# Patient Record
Sex: Male | Born: 2000 | Race: White | Hispanic: No | Marital: Single
Health system: Southern US, Community
[De-identification: ages and names within clinical notes are randomized; demographics above are authoritative.]

---
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
## 2021-04-13 ENCOUNTER — Emergency Department
Admission: EM | Admit: 2021-04-13 | Discharge: 2021-04-13 | Disposition: A | Discharge status: Home / Self Care | Payer: BC Managed Care – PPO | Source: Home / Self Care | Attending: Family Medicine | Admitting: Family Medicine

## 2021-04-13 ENCOUNTER — Other Ambulatory Visit: Payer: Self-pay

## 2021-04-13 ENCOUNTER — Encounter: Payer: Self-pay | Admitting: Emergency Medicine

## 2021-04-13 DIAGNOSIS — J111 Influenza due to unidentified influenza virus with other respiratory manifestations: Secondary | ICD-10-CM

## 2021-04-13 MED ORDER — OSELTAMIVIR PHOSPHATE 75 MG PO CAPS: 75.0000 mg | ORAL_CAPSULE | Freq: Two times a day (BID) | ORAL | 0 refills | Status: DC

## 2021-04-13 MED ORDER — ONDANSETRON HCL 8 MG PO TABS: 8.0000 mg | ORAL_TABLET | Freq: Three times a day (TID) | ORAL | 0 refills | Status: DC | PRN

## 2021-04-13 MED ORDER — ACETAMINOPHEN 325 MG PO TABS
650.0000 mg | ORAL_TABLET | Freq: Once | ORAL | Status: AC
  Administered 2021-04-13: 650 mg via ORAL

## 2021-04-13 NOTE — ED Triage Notes (Signed)
At college - roommate & girlfriend  both have flu  Pt started feeling sick yesterday  Body aches, cough & congestion  No tylenol today - took some last night No flu vaccine

## 2021-04-13 NOTE — Discharge Instructions (Signed)
Get plenty of rest Drink lots of fluids Take Zofran as needed for nausea and vomiting.   Take the Tamiflu 2 times a day for 5 days.  I have printed the Tamiflu because some pharmacies have limited supply (you may need to shop around) Call if not improving towards the end of the week

## 2021-04-13 NOTE — ED Provider Notes (Signed)
[facility]    CSN: [id number] Arrival date & time: 04/13/21  0911      History   Chief Complaint Chief Complaint  Patient presents with   Fever   Generalized Body Aches    HPI Kenneth Aguirre is a 20 y.o. male.    Patient is away at college.  His roommate has influenza.  Services girlfriend.  They were tested and found to have influenza A.  Patient started feeling sick yesterday with headaches, body aches, fatigue, postnasal drip, and cough.  Sudden onset.  He has not had a flu shot.  Has also had some nausea but no vomiting HPI  Past Medical History:  Diagnosis Date   Asthma     Patient Active Problem List   Diagnosis Date Noted   Pain in left ankle and joints of left foot 05/12/2020    History reviewed. No pertinent surgical history.     Home Medications    Prior to Admission medications   Medication Sig Start Date End Date Taking? Authorizing Provider  ondansetron (ZOFRAN) 8 MG tablet Take 1 tablet (8 mg total) by mouth every 8 (eight) hours as needed for nausea or vomiting. 04/13/21  Yes Eustace Moore, MD  oseltamivir (TAMIFLU) 75 MG capsule Take 1 capsule (75 mg total) by mouth every 12 (twelve) hours. 04/13/21  Yes Eustace Moore, MD  cetirizine (ZYRTEC) 10 MG tablet Take by mouth.    [provider]    Family History Family History  Problem Relation Age of Onset   Healthy Mother    Healthy Father    Healthy Sister    Healthy Sister    Healthy Sister     Social History Social History   Tobacco Use   Smoking status: Never    Passive exposure: Never   Smokeless tobacco: Never  Vaping Use   Vaping Use: Never used  Substance Use Topics   Alcohol use: Never   Drug use: Never     Allergies   Patient has no known allergies.   Review of Systems Review of Systems See HPI  Physical Exam Triage Vital Signs ED Triage Vitals  Enc Vitals Group     BP 04/13/21 0958 113/69     Pulse Rate 04/13/21 0958 82      Resp 04/13/21 0958 16     Temp 04/13/21 0958 99.6 F (37.6 C)     Temp Source 04/13/21 0958 Oral     SpO2 04/13/21 0958 100 %     Weight 04/13/21 1001 168 lb (76.2 kg)     Height 04/13/21 1001 5\' 9"  (1.753 m)     Head Circumference --      Peak Flow --      Pain Score 04/13/21 1001 5     Pain Loc --      Pain Edu? --      Excl. in GC? --    No data found.  Updated Vital Signs BP 113/69 (BP Location: Left Arm)   Pulse 82   Temp 99.6 F (37.6 C) (Oral)   Resp 16   Ht 5\' 9"  (1.753 m)   Wt 76.2 kg   SpO2 100%   BMI 24.81 kg/m       Physical Exam Constitutional:      General: He is not in acute distress.    Appearance: He is well-developed. He is ill-appearing.  HENT:     Head: Normocephalic and atraumatic.     Right Ear:  Tympanic membrane, ear canal and external ear normal.     Left Ear: Tympanic membrane, ear canal and external ear normal.     Nose: Congestion present.     Mouth/Throat:     Pharynx: Posterior oropharyngeal erythema present.  Eyes:     Conjunctiva/sclera: Conjunctivae normal.     Pupils: Pupils are equal, round, and reactive to light.  Cardiovascular:     Rate and Rhythm: Normal rate and regular rhythm.     Heart sounds: Normal heart sounds.  Pulmonary:     Effort: Pulmonary effort is normal. No respiratory distress.     Breath sounds: Normal breath sounds.  Abdominal:     General: There is no distension.     Palpations: Abdomen is soft.  Musculoskeletal:        General: Normal range of motion.     Cervical back: Normal range of motion.  Lymphadenopathy:     Cervical: No cervical adenopathy.  Skin:    General: Skin is warm and dry.  Neurological:     Mental Status: He is alert.  Psychiatric:        Mood and Affect: Mood normal.        Behavior: Behavior normal.     UC Treatments / Results  Labs (all labs ordered are listed, but only abnormal results are displayed) Labs Reviewed - No data to display  EKG   Radiology No results  found.  Procedures Procedures (including critical care time)  Medications Ordered in UC Medications  acetaminophen (TYLENOL) tablet 650 mg (650 mg Oral Given 04/13/21 1050)    Initial Impression / Assessment and Plan / UC Course  I have reviewed the triage vital signs and the nursing notes.  Pertinent labs & imaging results that were available during my care of the patient were reviewed by me and considered in my medical decision making (see chart for details).     Patient has influenza.  Discussed.  Note given for school Final Clinical Impressions(s) / UC Diagnoses   Final diagnoses:  Influenza     Discharge Instructions      Get plenty of rest Drink lots of fluids Take Zofran as needed for nausea and vomiting.   Take the Tamiflu 2 times a day for 5 days.  I have printed the Tamiflu because some pharmacies have limited supply (you may need to shop around) Call if not improving towards the end of the week   ED Prescriptions     Medication Sig Dispense Auth. Provider   ondansetron (ZOFRAN) 8 MG tablet Take 1 tablet (8 mg total) by mouth every 8 (eight) hours as needed for nausea or vomiting. 20 tablet Eustace Moore, MD   oseltamivir (TAMIFLU) 75 MG capsule Take 1 capsule (75 mg total) by mouth every 12 (twelve) hours. 10 capsule Eustace Moore, MD      PDMP not reviewed this encounter.   Eustace Moore, MD 04/13/21 [phone]

## 2021-04-15 ENCOUNTER — Telehealth: Payer: Self-pay

## 2021-04-15 NOTE — Telephone Encounter (Signed)
Call from pts mother requesting school note extension. Note left at front desk for pt to pick up

## 2021-05-20 ENCOUNTER — Ambulatory Visit: Payer: BC Managed Care – PPO | Admitting: Medical-Surgical

## 2021-05-26 ENCOUNTER — Ambulatory Visit: Payer: BC Managed Care – PPO | Admitting: Medical-Surgical

## 2021-12-10 ENCOUNTER — Encounter: Payer: Self-pay | Admitting: Family Medicine

## 2021-12-10 ENCOUNTER — Ambulatory Visit: Payer: BC Managed Care – PPO | Admitting: Family Medicine

## 2021-12-10 DIAGNOSIS — Z0000 Encounter for general adult medical examination without abnormal findings: Secondary | ICD-10-CM

## 2021-12-10 NOTE — Progress Notes (Signed)
Kenneth Aguirre - 21 y.o. male MRN [id number]  Date of birth: 01/15/2001  Subjective Chief Complaint  Patient presents with   Establish Care   Annual Exam    HPI Kenneth Aguirre is a 21 year old male here today for initial visit and annual exam.  He has been in fairly good health.  He has had some minor orthopedic issues.  Seen by Dr. Benjamin Stain last year.  He does exercise regularly.  Feels like his diet is pretty healthy.  He is a non-smoker.  Denies alcohol use.  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  He is studying business.  Review of Systems  Constitutional:  Negative for chills, fever, malaise/fatigue and weight loss.  HENT:  Negative for congestion, ear pain and sore throat.   Eyes:  Negative for blurred vision, double vision and pain.  Respiratory:  Negative for cough and shortness of breath.   Cardiovascular:  Negative for chest pain and palpitations.  Gastrointestinal:  Negative for abdominal pain, blood in stool, constipation, heartburn and nausea.  Genitourinary:  Negative for dysuria and urgency.  Musculoskeletal:  Negative for joint pain and myalgias.  Neurological:  Negative for dizziness and headaches.  Endo/Heme/Allergies:  Does not bruise/bleed easily.  Psychiatric/Behavioral:  Negative for depression. The patient is not nervous/anxious and does not have insomnia.     No Known Allergies  Past Medical History:  Diagnosis Date   Asthma     History reviewed. No pertinent surgical history.  Social History   Socioeconomic History   Marital status: Single    Spouse name: Not on file   Number of children: Not on file   Years of education: Not on file   Highest education level: Not on file  Occupational History   Occupation: Student - [place]  Tobacco Use   Smoking status: Never    Passive exposure: Never   Smokeless tobacco: Never  Vaping Use   Vaping Use: Never used  Substance and Sexual Activity   Alcohol use: Never   Drug use: Never    Sexual activity: Not Currently  Other Topics Concern   Not on file  Social History Narrative   Not on file   Social Determinants of Health   Financial Resource Strain: Not on file  Food Insecurity: Not on file  Transportation Needs: Not on file  Physical Activity: Not on file  Stress: Not on file  Social Connections: Not on file    Family History  Problem Relation Age of Onset   Healthy Mother    Healthy Father    Healthy Sister    Healthy Sister    Healthy Sister    Breast cancer Maternal Grandmother    Hypertension Paternal Grandfather    Heart attack Paternal Grandfather     Health Maintenance  Topic Date Due   HIV Screening  Never done   Hepatitis C Screening  Never done   COVID-19 Vaccine (4 - Pfizer series) 08/08/2020   INFLUENZA VACCINE  01/05/2022   TETANUS/TDAP  12/19/2023   HPV VACCINES  Completed     ----------------------------------------------------------------------------------------------------------------------------------------------------------------------------------------------------------------- Physical Exam BP (!) 93/55 (BP Location: Right Arm, Patient Position: Sitting, Cuff Size: Normal)   Pulse (!) 56   Ht 5' 8.7" (1.745 m)   Wt 178 lb 3.2 oz (80.8 kg)   SpO2 99%   BMI 26.55 kg/m   Physical Exam Constitutional:      General: He is not in acute distress. HENT:     Head: Normocephalic and atraumatic.  Right Ear: Tympanic membrane and external ear normal.     Left Ear: Tympanic membrane and external ear normal.  Eyes:     General: No scleral icterus. Neck:     Thyroid: No thyromegaly.  Cardiovascular:     Rate and Rhythm: Normal rate and regular rhythm.     Heart sounds: Normal heart sounds.  Pulmonary:     Effort: Pulmonary effort is normal.     Breath sounds: Normal breath sounds.  Abdominal:     General: Bowel sounds are normal. There is no distension.     Palpations: Abdomen is soft.     Tenderness: There is no  abdominal tenderness. There is no guarding.  Musculoskeletal:     Cervical back: Normal range of motion.  Lymphadenopathy:     Cervical: No cervical adenopathy.  Skin:    General: Skin is warm and dry.     Findings: No rash.  Neurological:     Mental Status: He is alert and oriented to person, place, and time.     Cranial Nerves: No cranial nerve deficit.     Motor: No abnormal muscle tone.  Psychiatric:        Mood and Affect: Mood normal.        Behavior: Behavior normal.     ------------------------------------------------------------------------------------------------------------------------------------------------------------------------------------------------------------------- Assessment and Plan  Well adult exam Well adult Screenings: Up-to-date Immunizations: Up-to-date Anticipatory guidance/risk factor reduction: Recommendations per AVS.   No orders of the defined types were placed in this encounter.   No follow-ups on file.    This visit occurred during the SARS-CoV-2 public health emergency.  Safety protocols were in place, including screening questions prior to the visit, additional usage of staff PPE, and extensive cleaning of exam room while observing appropriate contact time as indicated for disinfecting solutions.

## 2021-12-10 NOTE — Patient Instructions (Signed)
Preventive Care 20-21 Years Old, Male ?Preventive care refers to lifestyle choices and visits with your health care provider that can promote health and wellness. At this stage in your life, you may start seeing a primary care physician instead of a pediatrician for your preventive care. Preventive care visits are also called wellness exams. ?What can I expect for my preventive care visit? ?Counseling ?During your preventive care visit, your health care provider may ask about your: ?Medical history, including: ?Past medical problems. ?Family medical history. ?Current health, including: ?Home life and relationship well-being. ?Emotional well-being. ?Sexual activity and sexual health. ?Lifestyle, including: ?Alcohol, nicotine or tobacco, and drug use. ?Access to firearms. ?Diet, exercise, and sleep habits. ?Sunscreen use. ?Motor vehicle safety. ?Physical exam ?Your health care provider may check your: ?Height and weight. These may be used to calculate your BMI (body mass index). BMI is a measurement that tells if you are at a healthy weight. ?Waist circumference. This measures the distance around your waistline. This measurement also tells if you are at a healthy weight and may help predict your risk of certain diseases, such as type 2 diabetes and high blood pressure. ?Heart rate and blood pressure. ?Body temperature. ?Skin for abnormal spots. ?What immunizations do I need? ? ?Vaccines are usually given at various ages, according to a schedule. Your health care provider will recommend vaccines for you based on your age, medical history, and lifestyle or other factors, such as travel or where you work. ?What tests do I need? ?Screening ?Your health care provider may recommend screening tests for certain conditions. This may include: ?Vision and hearing tests. ?Lipid and cholesterol levels. ?Hepatitis B test. ?Hepatitis C test. ?HIV (human immunodeficiency virus) test. ?STI (sexually transmitted infection) testing, if  you are at risk. ?Tuberculosis skin test. ?Talk with your health care provider about your test results, treatment options, and if necessary, the need for more tests. ?Follow these instructions at home: ?Eating and drinking ? ?Eat a healthy diet that includes fresh fruits and vegetables, whole grains, lean protein, and low-fat dairy products. ?Drink enough fluid to keep your urine pale yellow. ?Do not drink alcohol if: ?Your health care provider tells you not to drink. ?You are under the legal drinking age. In the [place] the legal drinking age is 21. ?If you drink alcohol: ?Limit how much you have to 0-2 drinks a day. ?Know how much alcohol is in your drink. In the [place] one drink equals one 12 oz bottle of beer (355 mL), one 5 oz glass of wine (148 mL), or one 1? oz glass of hard liquor (44 mL). ?Lifestyle ?Brush your teeth every morning and night with fluoride toothpaste. Floss one time each day. ?Exercise for at least 30 minutes 5 or more days of the week. ?Do not use any products that contain nicotine or tobacco. These products include cigarettes, chewing tobacco, and vaping devices, such as e-cigarettes. If you need help quitting, ask your health care provider. ?Do not use drugs. ?If you are sexually active, practice safe sex. Use a condom or other form of protection to prevent STIs. ?Find healthy ways to manage stress, such as: ?Meditation, yoga, or listening to music. ?Journaling. ?Talking to a trusted person. ?Spending time with friends and family. ?Safety ?Always wear your seat belt while driving or riding in a vehicle. ?Do not drive: ?If you have been drinking alcohol. Do not ride with someone who has been drinking. ?When you are tired or distracted. ?While texting. ?If you have been using   any mind-altering substances or drugs. ?Wear a helmet and other protective equipment during sports activities. ?If you have firearms in your house, make sure you follow all gun safety procedures. ?Seek help if you have  been bullied, physically abused, or sexually abused. ?Use the internet responsibly to avoid dangers, such as online bullying and online sex predators. ?What's next? ?Go to your health care provider once a year for an annual wellness visit. ?Ask your health care provider how often you should have your eyes and teeth checked. ?Stay up to date on all vaccines. ?This information is not intended to replace advice given to you by your health care provider. Make sure you discuss any questions you have with your health care provider. ?Document Revised: 11/19/2020 Document Reviewed: 11/19/2020 ?Elsevier Patient Education ? 2023 Elsevier Inc. ? ?

## 2021-12-10 NOTE — Assessment & Plan Note (Signed)
Well adult Screenings: Up-to-date Immunizations: Up-to-date Anticipatory guidance/risk factor reduction: Recommendations per AVS. 

## 2022-11-05 ENCOUNTER — Ambulatory Visit: Payer: BC Managed Care – PPO | Admitting: Medical-Surgical

## 2022-11-05 ENCOUNTER — Encounter: Payer: Self-pay | Admitting: Medical-Surgical

## 2022-11-05 VITALS — BP 117/69 | HR 51 | Resp 20 | Ht 68.7 in | Wt 176.5 lb

## 2022-11-05 DIAGNOSIS — H5789 Other specified disorders of eye and adnexa: Secondary | ICD-10-CM | POA: Diagnosis not present

## 2022-11-05 MED ORDER — AMOXICILLIN-POT CLAVULANATE 875-125 MG PO TABS: 1.0000 | ORAL_TABLET | Freq: Two times a day (BID) | ORAL | 0 refills | Status: DC

## 2022-11-05 NOTE — Progress Notes (Signed)
        Established patient visit  History, exam, impression, and plan:  1. Eye swelling, right Pleasant 22 year old male presenting today for evaluation of right eye swelling and discomfort that started yesterday morning.  He awoke with pain just below the right eye and extending up into the right side of the nose.  The area was discolored and mildly swollen.  Notes that it was sore to touch and also uncomfortable with squinting or hard blinking.  This morning, it was even more swollen.  No blurry vision or pain with eye motions.  No discharge or crusting in the mornings.  No recent injury to the eye.  Denies fever, chills, vision changes, headache, dizziness.  Does have some mild sinus drainage but nothing significant.  On exam, EOM intact with grossly normal vision bilaterally.  Sclera white without injection or erythema.  Mild upper lid edema with lower eyelid purpleish discoloration and edema.  Tender to touch along the lower lid and nearing the tear duct.  Concern for possible cellulitis versus dacryocystitis.  Plan to treat with Augmentin twice daily for 1 week.  Reviewed recommendation for warm compresses several times a day.  Monitor for worsening of symptoms and if this occurs seek urgent care/ED evaluation immediately.  Procedures performed this visit: None.  Return if symptoms worsen or fail to improve.  __________________________________ Thayer Ohm, DNP, APRN, FNP-BC Primary Care and Sports Medicine [facility]

## 2022-12-23 ENCOUNTER — Ambulatory Visit: Payer: BC Managed Care – PPO | Admitting: Family Medicine

## 2022-12-23 ENCOUNTER — Encounter: Payer: Self-pay | Admitting: Family Medicine

## 2022-12-23 VITALS — BP 98/61 | HR 54 | Ht 68.7 in | Wt 175.0 lb

## 2022-12-23 DIAGNOSIS — S93402A Sprain of unspecified ligament of left ankle, initial encounter: Secondary | ICD-10-CM | POA: Diagnosis not present

## 2022-12-23 DIAGNOSIS — F418 Other specified anxiety disorders: Secondary | ICD-10-CM

## 2022-12-23 MED ORDER — SERTRALINE HCL 100 MG PO TABS
100.0000 mg | ORAL_TABLET | Freq: Every day | ORAL | 1 refills | Status: DC
Start: 2022-12-23 — End: 2023-06-21

## 2022-12-23 NOTE — Patient Instructions (Signed)
Ankle Sprain  An ankle sprain is a stretch or tear in a ligament in your ankle. Ligaments are tissues that connect bones to each other.  An ankle sprain can happen when: The ankle rolls outward. This is called an inversion sprain. The ankle rolls inward. This is called an eversion sprain. What are the causes? An ankle sprain is caused by rolling or twisting your ankle. What increases the risk? You are more likely to get an ankle sprain if you play sports. What are the signs or symptoms?  Pain in your ankle. Swelling. Bruising. Bruises may form right after you sprain your ankle or 1-2 days later. Trouble standing or walking. How is this treated? An ankle sprain may be treated with: A brace or splint. This is used to keep the ankle from moving until it heals. An elastic bandage (dressing). This is used to support the ankle. Crutches. Pain medicine. Surgery. This may be needed if the sprain is very bad. Physical therapy. This can help you move your ankle better. Follow these instructions at home: If you have a brace or a splint that can be taken off: Wear the brace or splint as told by your doctor. Take it off only as told by your doctor. Check the skin around the brace or splint every day. Tell your doctor if you see problems. Loosen the brace or splint if your toes: Tingle. Become numb. Turn cold and blue. Keep the brace or splint clean and dry. If the brace or splint is not waterproof: Do not let it get wet. Cover it with a watertight covering when you take a bath or a shower. If you have an elastic dressing: Take it off to shower or bathe. Adjust it if it feels too tight. Loosen the dressing if your foot: Tingles. Becomes numb. Turns cold and blue. Managing pain, stiffness, and swelling If told, put ice on the affected area. If you have a removable brace or splint, take it off as told by your doctor. Put ice in a plastic bag. Place a towel between your skin and the  bag. Leave the ice on for 20 minutes, 2-3 times a day. If your skin turns bright red, take off the ice right away to prevent skin damage. The risk of damage is higher if you cannot feel pain, heat, or cold. Move your toes often. Raise your ankle above the level of your heart while you are sitting or lying down. General instructions Take over-the-counter and prescription medicines only as told by your doctor. Do not smoke or use any products that contain nicotine or tobacco. If you need help quitting, ask your doctor. Rest your ankle. Use crutches to support your body weight. Do not use your injured leg to support your body weight until your doctor says that you can. Ask your doctor when it is safe to drive if you have a brace or splint on your ankle. Contact a doctor if: Your bruises or swelling get worse all of a sudden. Your pain does not get better after you take medicine. Get help right away if: Your foot or toes are numb or blue. You have very bad pain that gets worse. This information is not intended to replace advice given to you by your health care provider. Make sure you discuss any questions you have with your health care provider. Document Revised: 02/24/2022 Document Reviewed: 02/24/2022 Elsevier Patient Education  2024 [employer].

## 2022-12-23 NOTE — Assessment & Plan Note (Signed)
He does have some mild TTP over the lateral malleolus. He is able to weight bear and walk on the ankle.   Declines imaging  today, he will let me know if not improving.  Provided with Ace wrap.

## 2022-12-23 NOTE — Assessment & Plan Note (Signed)
Doing fairly well with sertraline, increasing to 100mg  daily.  F/u in 6 weeks.  Will plan to to do this virtually since he will be back in school.

## 2022-12-23 NOTE — Progress Notes (Signed)
Kenneth Aguirre - 22 y.o. male MRN [id number]  Date of birth: Sep 13, 2000  Subjective Chief Complaint  Patient presents with   Ankle Injury    HPI Kenneth Aguirre is a 22 y.o. male here today for follow up visit.   He has had some increased depression and anxiety symptoms.  He reached out to an online company called [employer]" and was prescribed Sertraline.  Currently taking 50mg   daily.  This has helped, however he feels like he would like to try increasing the strength of this some.  He is tolerating this well at current strength.   He also injured his L ankle while playing soccer yesterday.  Inverted ankle while trying to go for a ball.  Has had swelling and pain along the lateral ankle.  He is able to bear weight but has pain with this.    ROS:  A comprehensive ROS was completed and negative except as noted per HPI  No Known Allergies  Past Medical History:  Diagnosis Date   Asthma     History reviewed. No pertinent surgical history.  Social History   Socioeconomic History   Marital status: Single    Spouse name: Not on file   Number of children: Not on file   Years of education: Not on file   Highest education level: Not on file  Occupational History   Occupation: Student - [place]  Tobacco Use   Smoking status: Never    Passive exposure: Never   Smokeless tobacco: Never  Vaping Use   Vaping status: Never Used  Substance and Sexual Activity   Alcohol use: Never   Drug use: Never   Sexual activity: Not Currently  Other Topics Concern   Not on file  Social History Narrative   Not on file   Social Determinants of Health   Financial Resource Strain: Not on file  Food Insecurity: Not on file  Transportation Needs: Not on file  Physical Activity: Not on file  Stress: Not on file  Social Connections: Not on file    Family History  Problem Relation Age of Onset   Healthy Mother    Healthy Father    Healthy Sister    Healthy Sister    Healthy  Sister    Breast cancer Maternal Grandmother    Hypertension Paternal Grandfather    Heart attack Paternal Grandfather     Health Maintenance  Topic Date Due   COVID-19 Vaccine (4 - 2023-24 season) 03/25/2023 (Originally 02/05/2022)   Hepatitis C Screening  12/23/2023 (Originally 03/28/2020)   HIV Screening  12/23/2023 (Originally 03/28/2017)   INFLUENZA VACCINE  01/06/2023   DTaP/Tdap/Td (7 - Td or Tdap) 12/19/2023   HPV VACCINES  Completed     ----------------------------------------------------------------------------------------------------------------------------------------------------------------------------------------------------------------- Physical Exam BP 98/61 (BP Location: Left Arm, Patient Position: Sitting, Cuff Size: Normal)   Pulse (!) 54   Ht 5' 8.7" (1.745 m)   Wt 175 lb (79.4 kg)   SpO2 97%   BMI 26.07 kg/m   Physical Exam Constitutional:      Appearance: Normal appearance.  HENT:     Head: Normocephalic and atraumatic.  Musculoskeletal:     Comments: TTP along ligamentous complexes of the L lateral ankle.  He does have ttp along the lateral malleolus  ROM is limited with pain on inversion.   Neurological:     Mental Status: He is alert.  Psychiatric:        Mood and Affect: Mood normal.  Behavior: Behavior normal.     ------------------------------------------------------------------------------------------------------------------------------------------------------------------------------------------------------------------- Assessment and Plan  Depression with anxiety Doing fairly well with sertraline, increasing to 100mg  daily.  F/u in 6 weeks.  Will plan to to do this virtually since he will be back in school.   Sprain of left ankle He does have some mild TTP over the lateral malleolus. He is able to weight bear and walk on the ankle.   Declines imaging  today, he will let me know if not improving.  Provided with Ace wrap.   Meds  ordered this encounter  Medications   sertraline (ZOLOFT) 100 MG tablet    Sig: Take 1 tablet (100 mg total) by mouth daily.    Dispense:  90 tablet    Refill:  1    Return in about 6 weeks (around 02/03/2023) for F/u Sertraline-Virtual visit.    This visit occurred during the SARS-CoV-2 public health emergency.  Safety protocols were in place, including screening questions prior to the visit, additional usage of staff PPE, and extensive cleaning of exam room while observing appropriate contact time as indicated for disinfecting solutions.

## 2023-02-03 ENCOUNTER — Encounter: Payer: Self-pay | Admitting: Family Medicine

## 2023-02-03 ENCOUNTER — Telehealth (INDEPENDENT_AMBULATORY_CARE_PROVIDER_SITE_OTHER): Payer: BC Managed Care – PPO | Admitting: Family Medicine

## 2023-02-03 VITALS — HR 55 | Ht 68.0 in | Wt 170.0 lb

## 2023-02-03 DIAGNOSIS — S93402A Sprain of unspecified ligament of left ankle, initial encounter: Secondary | ICD-10-CM

## 2023-02-03 DIAGNOSIS — F418 Other specified anxiety disorders: Secondary | ICD-10-CM

## 2023-02-03 NOTE — Progress Notes (Signed)
Kenneth Aguirre - 22 y.o. male MRN [id number]  Date of birth: 07-18-2000   This visit type was conducted due to national recommendations for restrictions regarding the COVID-19 Pandemic (e.g. social distancing).  This format is felt to be most appropriate for this patient at this time.  All issues noted in this document were discussed and addressed.  No physical exam was performed (except for noted visual exam findings with Video Visits).  I discussed the limitations of evaluation and management by telemedicine and the availability of in person appointments. The patient expressed understanding and agreed to proceed.  I connected withNAME@ on 02/03/23 at 11:30 AM EDT by a video enabled telemedicine application and verified that I am speaking with the correct person using two identifiers.  Present at visit: [name], DO Nickie Retort   Patient Location: Home [address] [place] [address]   Provider location:   [facility]  Chief Complaint  Patient presents with   Flu   Sertraline    6 week follow up    HPI  Kenneth Aguirre is a 22 y.o. male who presents via audio/video conferencing for a telehealth visit today.  He reports that he is doing well.  Feels good with increased strength of sertraline.  No side effect with increased strength.   Ankle seems to be improving.  He is using home exercises.     02/03/2023   10:17 AM 12/23/2022   10:22 AM 11/05/2022    1:35 PM  Depression screen PHQ 2/9  Decreased Interest 0 2 1  Down, Depressed, Hopeless 0 1 1  PHQ - 2 Score 0 3 2  Altered sleeping 1 1 2   Tired, decreased energy 0 1 2  Change in appetite 0 2 2  Feeling bad or failure about yourself  0 0 0  Trouble concentrating 0 2 1  Moving slowly or fidgety/restless 0 0 0  Suicidal thoughts 0 0 0  PHQ-9 Score 1 9 9   Difficult doing work/chores Not difficult at all Somewhat difficult Somewhat difficult      02/03/2023   10:18 AM 12/23/2022   10:23 AM 11/05/2022     1:36 PM 12/10/2021   11:10 AM  GAD 7 : Generalized Anxiety Score  Nervous, Anxious, on Edge 1 1 2 2   Control/stop worrying 0 1 0 1  Worry too much - different things 0 2 2 2   Trouble relaxing 1 2 2 2   Restless 1 1 1 1   Easily annoyed or irritable 0 1 1 0  Afraid - awful might happen 0 0 0 0  Total GAD 7 Score 3 8 8 8   Anxiety Difficulty Not difficult at all Somewhat difficult Somewhat difficult Somewhat difficult     ROS:  A comprehensive ROS was completed and negative except as noted per HPI  Past Medical History:  Diagnosis Date   Asthma     History reviewed. No pertinent surgical history.  Family History  Problem Relation Age of Onset   Healthy Mother    Healthy Father    Healthy Sister    Healthy Sister    Healthy Sister    Breast cancer Maternal Grandmother    Hypertension Paternal Grandfather    Heart attack Paternal Grandfather     Social History   Socioeconomic History   Marital status: Single    Spouse name: Not on file   Number of children: Not on file   Years of education: Not on file   Highest education level:  Not on file  Occupational History   Occupation: Student - [place]  Tobacco Use   Smoking status: Never    Passive exposure: Never   Smokeless tobacco: Never  Vaping Use   Vaping status: Never Used  Substance and Sexual Activity   Alcohol use: Never   Drug use: Never   Sexual activity: Not Currently  Other Topics Concern   Not on file  Social History Narrative   Not on file   Social Determinants of Health   Financial Resource Strain: Not on file  Food Insecurity: Not on file  Transportation Needs: Not on file  Physical Activity: Not on file  Stress: Not on file  Social Connections: Not on file  Intimate Partner Violence: Not on file     Current Outpatient Medications:    sertraline (ZOLOFT) 100 MG tablet, Take 1 tablet (100 mg total) by mouth daily., Disp: 90 tablet, Rfl: 1  EXAM:  VITALS per patient if applicable:  Pulse (!) 55   Ht 5\' 8"  (1.727 m)   Wt 170 lb (77.1 kg)   BMI 25.85 kg/m   GENERAL: alert, oriented, appears well and in no acute distress  HEENT: atraumatic, conjunttiva clear, no obvious abnormalities on inspection of external nose and ears  NECK: normal movements of the head and neck  LUNGS: on inspection no signs of respiratory distress, breathing rate appears normal, no obvious gross SOB, gasping or wheezing  CV: no obvious cyanosis  MS: moves all visible extremities without noticeable abnormality  PSYCH/NEURO: pleasant and cooperative, no obvious depression or anxiety, speech and thought processing grossly intact  ASSESSMENT AND PLAN:  Discussed the following assessment and plan:  Depression with anxiety Will plan to continue sertraline at current strength.  F/u in 3-4 months or sooner if needed.   Sprain of left ankle Improving.  Continue home exercises.  Brace may be helpful as well.      I discussed the assessment and treatment plan with the patient. The patient was provided an opportunity to ask questions and all were answered. The patient agreed with the plan and demonstrated an understanding of the instructions.   The patient was advised to call back or seek an in-person evaluation if the symptoms worsen or if the condition fails to improve as anticipated.    [name], DO

## 2023-02-03 NOTE — Assessment & Plan Note (Signed)
Improving.  Continue home exercises.  Brace may be helpful as well.

## 2023-02-03 NOTE — Assessment & Plan Note (Signed)
Will plan to continue sertraline at current strength.  F/u in 3-4 months or sooner if needed.

## 2023-02-21 ENCOUNTER — Telehealth (INDEPENDENT_AMBULATORY_CARE_PROVIDER_SITE_OTHER): Payer: BC Managed Care – PPO | Admitting: Family Medicine

## 2023-02-21 ENCOUNTER — Encounter: Payer: Self-pay | Admitting: Family Medicine

## 2023-02-21 VITALS — Ht 68.0 in | Wt 170.0 lb

## 2023-02-21 DIAGNOSIS — F418 Other specified anxiety disorders: Secondary | ICD-10-CM

## 2023-02-21 NOTE — Assessment & Plan Note (Signed)
He would prefer to continue with sertraline as this as worked well for anxiety.  He is already taking in the morning.  Discussed other strategies to help with sleep including sleep hygiene.  May try melatonin.  Discussed change to lexapro if he continues to have side effects.

## 2023-02-21 NOTE — Progress Notes (Signed)
Having difficulty falling asleep and waking up. Not sure if Zoloft is contributing.

## 2023-02-21 NOTE — Progress Notes (Signed)
Kenneth Aguirre - 22 y.o. male MRN [id number]  Date of birth: [birth year]   This visit type was conducted due to national recommendations for restrictions regarding the COVID-19 Pandemic (e.g. social distancing).  This format is felt to be most appropriate for this patient at this time.  All issues noted in this document were discussed and addressed.  No physical exam was performed (except for noted visual exam findings with Video Visits).  I discussed the limitations of evaluation and management by telemedicine and the availability of in person appointments. The patient expressed understanding and agreed to proceed.  I connected withNAME@ on 02/21/23 at  1:10 PM EDT by a video enabled telemedicine application and verified that I am speaking with the correct person using two identifiers.  Present at visit: [name], DO Nickie Retort   Patient Location: Home [address] [place] [address]   Provider location:   [facility]  Chief Complaint  Patient presents with   Mood    HPI  Kenneth Aguirre is a 22 y.o. male who presents via audio/video conferencing for a telehealth visit today.  History of anxiety and depression that is currently treated with sertraline 100mg .  He does feel that this is quite effective for his anxiety, however he is having increased difficulty falling asleep.  This is causing him to sleep in and occasionally miss class.  This has worsened since increase to 100mg  strength.  He has not really had any other notable side effects.  He is taking in the morning.    ROS:  A comprehensive ROS was completed and negative except as noted per HPI  Past Medical History:  Diagnosis Date   Asthma     History reviewed. No pertinent surgical history.  Family History  Problem Relation Age of Onset   Healthy Mother    Healthy Father    Healthy Sister    Healthy Sister    Healthy Sister    Breast cancer Maternal Grandmother    Hypertension Paternal  Grandfather    Heart attack Paternal Grandfather     Social History   Socioeconomic History   Marital status: Single    Spouse name: Not on file   Number of children: Not on file   Years of education: Not on file   Highest education level: Not on file  Occupational History   Occupation: Student - [place]  Tobacco Use   Smoking status: Never    Passive exposure: Never   Smokeless tobacco: Never  Vaping Use   Vaping status: Never Used  Substance and Sexual Activity   Alcohol use: Never   Drug use: Never   Sexual activity: Not Currently  Other Topics Concern   Not on file  Social History Narrative   Not on file   Social Determinants of Health   Financial Resource Strain: Not on file  Food Insecurity: Not on file  Transportation Needs: Not on file  Physical Activity: Not on file  Stress: Not on file  Social Connections: Not on file  Intimate Partner Violence: Not on file     Current Outpatient Medications:    sertraline (ZOLOFT) 100 MG tablet, Take 1 tablet (100 mg total) by mouth daily., Disp: 90 tablet, Rfl: 1  EXAM:  VITALS per patient if applicable: Ht 5\' 8"  (1.727 m)   Wt 170 lb (77.1 kg)   BMI 25.85 kg/m   GENERAL: alert, oriented, appears well and in no acute distress  HEENT: atraumatic, conjunttiva clear, no obvious  abnormalities on inspection of external nose and ears  NECK: normal movements of the head and neck  LUNGS: on inspection no signs of respiratory distress, breathing rate appears normal, no obvious gross SOB, gasping or wheezing  CV: no obvious cyanosis  MS: moves all visible extremities without noticeable abnormality  PSYCH/NEURO: pleasant and cooperative, no obvious depression or anxiety, speech and thought processing grossly intact  ASSESSMENT AND PLAN:  Discussed the following assessment and plan:  Depression with anxiety He would prefer to continue with sertraline as this as worked well for anxiety.  He is already taking in  the morning.  Discussed other strategies to help with sleep including sleep hygiene.  May try melatonin.  Discussed change to lexapro if he continues to have side effects.      I discussed the assessment and treatment plan with the patient. The patient was provided an opportunity to ask questions and all were answered. The patient agreed with the plan and demonstrated an understanding of the instructions.   The patient was advised to call back or seek an in-person evaluation if the symptoms worsen or if the condition fails to improve as anticipated.    [name], DO

## 2023-04-14 ENCOUNTER — Encounter: Payer: Self-pay | Admitting: Family Medicine

## 2023-04-14 ENCOUNTER — Telehealth: Payer: BC Managed Care – PPO | Admitting: Family Medicine

## 2023-04-14 VITALS — Ht 68.0 in | Wt 170.0 lb

## 2023-04-14 DIAGNOSIS — J0100 Acute maxillary sinusitis, unspecified: Secondary | ICD-10-CM

## 2023-04-14 MED ORDER — AMOXICILLIN-POT CLAVULANATE 875-125 MG PO TABS: 1.0000 | ORAL_TABLET | Freq: Two times a day (BID) | ORAL | 0 refills | Status: DC

## 2023-04-14 NOTE — Progress Notes (Signed)
Kenneth Aguirre - 22 y.o. male MRN [id number]  Date of birth: 11-May-2001   This visit type was conducted due to national recommendations for restrictions regarding the COVID-19 Pandemic (e.g. social distancing).  This format is felt to be most appropriate for this patient at this time.  All issues noted in this document were discussed and addressed.  No physical exam was performed (except for noted visual exam findings with Video Visits).  I discussed the limitations of evaluation and management by telemedicine and the availability of in person appointments. The patient expressed understanding and agreed to proceed.  I connected withNAME@ on 04/14/23 at  8:50 AM EST by a video enabled telemedicine application and verified that I am speaking with the correct person using two identifiers.  Present at visit: [name], DO Nickie Retort   Patient Location: Home [address] [place] [address]   Provider location:   [facility]  Chief Complaint  Patient presents with   URI    HPI  Kenneth Aguirre is a 22 y.o. male who presents via audio/video conferencing for a telehealth visit today.  He has had sinus pain and pressure with post nasal drainage, cough and fatigue for about 1 week.  Worsening since onset.  He did have  fever yesterday.  His cheeks feels puffy and swollen.  Mucus is thick and yellow in color.  Denies wheezing, dyspnea, headache or body aches.  He is drinking plenty of fluids.     ROS:  A comprehensive ROS was completed and negative except as noted per HPI  Past Medical History:  Diagnosis Date   Asthma     No past surgical history on file.  Family History  Problem Relation Age of Onset   Healthy Mother    Healthy Father    Healthy Sister    Healthy Sister    Healthy Sister    Breast cancer Maternal Grandmother    Hypertension Paternal Grandfather    Heart attack Paternal Grandfather     Social History   Socioeconomic History   Marital  status: Single    Spouse name: Not on file   Number of children: Not on file   Years of education: Not on file   Highest education level: Not on file  Occupational History   Occupation: Student - [employer] [place]  Tobacco Use   Smoking status: Never    Passive exposure: Never   Smokeless tobacco: Never  Vaping Use   Vaping status: Never Used  Substance and Sexual Activity   Alcohol use: Never   Drug use: Never   Sexual activity: Not Currently  Other Topics Concern   Not on file  Social History Narrative   Not on file   Social Determinants of Health   Financial Resource Strain: Not on file  Food Insecurity: Not on file  Transportation Needs: Not on file  Physical Activity: Not on file  Stress: Not on file  Social Connections: Not on file  Intimate Partner Violence: Not on file     Current Outpatient Medications:    amoxicillin-clavulanate (AUGMENTIN) 875-125 MG tablet, Take 1 tablet by mouth 2 (two) times daily., Disp: 20 tablet, Rfl: 0   sertraline (ZOLOFT) 100 MG tablet, Take 1 tablet (100 mg total) by mouth daily., Disp: 90 tablet, Rfl: 1  EXAM:  VITALS per patient if applicable: Ht 5\' 8"  (1.727 m)   Wt 170 lb (77.1 kg)   BMI 25.85 kg/m   GENERAL: alert, oriented, appears well and in  no acute distress  HEENT: atraumatic, conjunttiva clear, no obvious abnormalities on inspection of external nose and ears  NECK: normal movements of the head and neck  LUNGS: on inspection no signs of respiratory distress, breathing rate appears normal, no obvious gross SOB, gasping or wheezing  CV: no obvious cyanosis  MS: moves all visible extremities without noticeable abnormality  PSYCH/NEURO: pleasant and cooperative, no obvious depression or anxiety, speech and thought processing grossly intact  ASSESSMENT AND PLAN:  Discussed the following assessment and plan:  Acute maxillary sinusitis I have prescribed augmentin for him.  Recommend continued supportive care with OTC  medications  and good hydration.  Recommend that he have in person evaluation if symptoms are not improving.      I discussed the assessment and treatment plan with the patient. The patient was provided an opportunity to ask questions and all were answered. The patient agreed with the plan and demonstrated an understanding of the instructions.   The patient was advised to call back or seek an in-person evaluation if the symptoms worsen or if the condition fails to improve as anticipated.    [name], DO

## 2023-04-14 NOTE — Progress Notes (Signed)
8:04am: attempted to contact the patient for pre-appt workup. No answer.   Sx: puffy face, sore throat, ear fullness, sinus inflammation. Sx started: 4 days prior Meds: Tylenol  Pt currently at [facility]. Added new pharmacy/

## 2023-04-14 NOTE — Assessment & Plan Note (Signed)
I have prescribed augmentin for him.  Recommend continued supportive care with OTC medications  and good hydration.  Recommend that he have in person evaluation if symptoms are not improving.

## 2023-06-21 ENCOUNTER — Other Ambulatory Visit: Payer: Self-pay | Admitting: Family Medicine

## 2023-06-21 NOTE — Telephone Encounter (Signed)
 Requesting rx rf of sertralline 100mg  Last written 12/23/2022 Last OV11/12/2022 ( sick visit)  Upcoming appt = none

## 2024-02-23 ENCOUNTER — Ambulatory Visit: Payer: Self-pay

## 2024-02-23 NOTE — Telephone Encounter (Signed)
 Patient is scheduled 02/24/2024 with Darice Brownie , NP

## 2024-02-23 NOTE — Telephone Encounter (Signed)
 FYI Only or Action Required?: FYI only for provider.  Patient was last seen in primary care on 04/14/2023 by Alvia Bring, DO.  Called Nurse Triage reporting Facial Pain.  Symptoms began several days ago.  Interventions attempted: OTC decongestant  Symptoms are: gradually worsening.  Triage Disposition: See Physician Within 24 Hours  Patient/caregiver understands and will follow disposition?: Yes, will follow disposition  Copied from CRM #[id number]. Topic: Clinical - Red Word Triage >> Feb 23, 2024  3:42 PM Zane F wrote: Red Word that prompted transfer to Nurse Triage:   Concern: possible sinus infection     Symptoms: ear pressure; face pressure; congestion; discolored very green and yellow   When did the symptoms start?: 02/19/24-02/20/2024   What have you done to aid in the concern ? Have you taken anything to assist with the matter?: Yes    If so, what did you take?: 12 hr mucinex but saw no relief Reason for Disposition  Earache  Answer Assessment - Initial Assessment Questions 1. LOCATION: Where does it hurt?      Face, tight under eyes and bridge of nose 2. ONSET: When did the sinus pain start?  (e.g., hours, days)      3-4 days ago 3. SEVERITY: How bad is the pain?   (Scale 0-10; or none, mild, moderate or severe)     4 4. RECURRENT SYMPTOM: Have you ever had sinus problems before? If Yes, ask: When was the last time? and What happened that time?      Hx of sinus infections, this feels the same 5. NASAL CONGESTION: Is the nose blocked? If Yes, ask: Can you open it or must you breathe through your mouth?     Nasal congestion 6. NASAL DISCHARGE: Do you have discharge from your nose? If so ask, What color?     Bright yellow to dark green 7. FEVER: Do you have a fever? If Yes, ask: What is it, how was it measured, and when did it start?      denies 8. OTHER SYMPTOMS: Do you have any other symptoms? (e.g., sore throat, cough,  earache, difficulty breathing)     Ear fullness, sore throat each AM  Scheduled VV.  Protocols used: Sinus Pain or Congestion-A-AH

## 2024-02-24 ENCOUNTER — Telehealth (INDEPENDENT_AMBULATORY_CARE_PROVIDER_SITE_OTHER): Admitting: Family Medicine

## 2024-02-24 ENCOUNTER — Encounter: Payer: Self-pay | Admitting: Family Medicine

## 2024-02-24 DIAGNOSIS — J0120 Acute ethmoidal sinusitis, unspecified: Secondary | ICD-10-CM | POA: Diagnosis not present

## 2024-02-24 MED ORDER — AMOXICILLIN-POT CLAVULANATE 875-125 MG PO TABS: 1.0000 | ORAL_TABLET | Freq: Two times a day (BID) | ORAL | 0 refills | Status: AC

## 2024-02-24 MED ORDER — FLUTICASONE PROPIONATE 50 MCG/ACT NA SUSP: 2.0000 | Freq: Every day | NASAL | 0 refills | Status: DC

## 2024-02-24 NOTE — Progress Notes (Signed)
 Acute Office Visit  Subjective:     Patient ID: Kenneth Aguirre, male    DOB: [birth year], 23 y.o.   MRN: [id number]  Chief Complaint  Patient presents with   Sinus Problem    Nasal congestion   I connected with  Kenneth Aguirre on 02/24/24 by a video enabled telemedicine application and verified that I am speaking with the correct person using two identifiers.   I discussed the limitations of evaluation and management by telemedicine. The patient expressed understanding and agreed to proceed. Virtual Visit via Video Note  I connected with Kenneth Aguirre on 02/24/24 at 10:50 AM EDT by a video enabled telemedicine application and verified that I am speaking with the correct person using two identifiers.  Location: Patient: home  Provider: office    I discussed the limitations of evaluation and management by telemedicine and the availability of in person appointments. The patient expressed understanding and agreed to proceed.  History of Present Illness:    Observations/Objective:   Assessment and Plan:   Follow Up Instructions:    I discussed the assessment and treatment plan with the patient. The patient was provided an opportunity to ask questions and all were answered. The patient agreed with the plan and demonstrated an understanding of the instructions.   The patient was advised to call back or seek an in-person evaluation if the symptoms worsen or if the condition fails to improve as anticipated.    Darice JONELLE Brownie, FNP   HPI Patient is in today for nasal congestion that started on Sunday night. Symptoms have gotten worse. There is pain in the ethmoid sinus area and in both ears, worse on right.  No fever. Feels like his head is heavy on his shoulders Tried mucinex and vitamin C.   ROS      Objective:    There were no vitals taken for this visit. BP Readings from Last 3 Encounters:  12/23/22 98/61  11/05/22 117/69  12/10/21 (!) 93/55       Physical Exam Constitutional:      General: He is not in acute distress.    Appearance: Normal appearance. He is ill-appearing.  Pulmonary:     Effort: Pulmonary effort is normal.  Skin:    General: Skin is warm and dry.  Neurological:     General: No focal deficit present.     Mental Status: He is alert. Mental status is at baseline.  Psychiatric:        Mood and Affect: Mood normal.        Behavior: Behavior normal.        Thought Content: Thought content normal.        Judgment: Judgment normal.     No results found for any visits on 02/24/24.      Assessment & Plan:   Problem List Items Addressed This Visit     Acute non-recurrent ethmoidal sinusitis - Primary   Nasal congestion with pain in the ethmoid sinus. Symptoms have gotten worse since onset. OTC treatments have not worked. Symptoms are consistent with acute sinusitis. Augmentin  875-125 mg BID for 10 days.  Sinus rinses followed by Flonase  nasal spray as instructed. Follow-up if symptoms do not resolve.         Relevant Medications   amoxicillin -clavulanate (AUGMENTIN ) 875-125 MG tablet   fluticasone  (FLONASE ) 50 MCG/ACT nasal spray    Meds ordered this encounter  Medications   amoxicillin -clavulanate (AUGMENTIN ) 875-125 MG tablet    Sig: Take 1 tablet  by mouth 2 (two) times daily.    Dispense:  20 tablet    Refill:  0    Supervising Provider:   METHENEY, CATHERINE D [[id number]]   fluticasone  (FLONASE ) 50 MCG/ACT nasal spray    Sig: Place 2 sprays into both nostrils daily.    Dispense:  9.9 g    Refill:  0    Supervising Provider:   METHENEY, CATHERINE D [[id number]]  Agrees with plan of care discussed.  Questions answered.  Return if symptoms worsen or fail to improve.  Darice JONELLE Brownie, FNP

## 2024-02-24 NOTE — Patient Instructions (Signed)
 You were prescribed an antibiotic today. Please complete the full course.  Acetaminophen or ibuprofen  for fever according to package instructions, do not exceed recommended doses.  Adequate fluids to avoid dehydration. Lots of rest while you are recovering.  Follow-up if your symptoms do not improve.

## 2024-02-24 NOTE — Assessment & Plan Note (Addendum)
 Nasal congestion with pain in the ethmoid sinus. Symptoms have gotten worse since onset. OTC treatments have not worked. Symptoms are consistent with acute sinusitis. Augmentin  875-125 mg BID for 10 days.  Sinus rinses followed by Flonase  nasal spray as instructed. Follow-up if symptoms do not resolve.

## 2024-03-17 ENCOUNTER — Other Ambulatory Visit: Payer: Self-pay | Admitting: Family Medicine

## 2024-03-17 DIAGNOSIS — J0120 Acute ethmoidal sinusitis, unspecified: Secondary | ICD-10-CM
